# Patient Record
Sex: Male | Born: 1970 | Race: White | Hispanic: No | Marital: Single | Smoking: Never smoker
Health system: Southern US, Community
[De-identification: ages and names within clinical notes are randomized; demographics above are authoritative.]

## PROBLEM LIST (undated history)

## (undated) HISTORY — PX: KNEE SURGERY: SHX244

## (undated) HISTORY — PX: APPENDECTOMY: SHX54

---

## 2011-09-10 ENCOUNTER — Emergency Department (INDEPENDENT_AMBULATORY_CARE_PROVIDER_SITE_OTHER)
Admission: EM | Admit: 2011-09-10 | Discharge: 2011-09-10 | Disposition: A | Discharge status: Home / Self Care | Payer: Managed Care, Other (non HMO) | Source: Home / Self Care | Attending: Emergency Medicine | Admitting: Emergency Medicine

## 2011-09-10 DIAGNOSIS — J029 Acute pharyngitis, unspecified: Secondary | ICD-10-CM

## 2011-09-10 DIAGNOSIS — R05 Cough: Secondary | ICD-10-CM

## 2011-09-10 LAB — POCT RAPID STREP A (OFFICE): Rapid Strep A Screen: NEGATIVE

## 2011-09-10 MED ORDER — AZITHROMYCIN 250 MG PO TABS: ORAL_TABLET | ORAL | Status: AC

## 2011-09-10 MED ORDER — GUAIFENESIN-CODEINE 100-10 MG/5ML PO SYRP: 5.0000 mL | ORAL_SOLUTION | Freq: Four times a day (QID) | ORAL | Status: AC | PRN

## 2011-09-10 NOTE — ED Notes (Signed)
Symptoms started 3-4 days ago.

## 2011-09-10 NOTE — ED Provider Notes (Signed)
History     CSN: [id number] Arrival date & time: No admission date for patient encounter.   None     No chief complaint on file.   (Consider location/radiation/quality/duration/timing/severity/associated sxs/prior treatment) HPI Caidon is a 40 y.o. male who complains of onset of cold symptoms for 4 days.  + sore throat + cough No pleuritic pain No wheezing +nasal congestion + post-nasal drainage No sinus pain/pressure No chest congestion No itchy/red eyes No earache No hemoptysis No SOB No chills/sweats No fever No nausea No vomiting No abdominal pain No diarrhea No skin rashes + fatigue No myalgias No headache    No past medical history on file.  No past surgical history on file.  No family history on file.  History  Substance Use Topics  . Smoking status: Not on file  . Smokeless tobacco: Not on file  . Alcohol Use: Not on file      Review of Systems  Allergies  Review of patient's allergies indicates not on file.  Home Medications  No current outpatient prescriptions on file.  There were no vitals taken for this visit.  Physical Exam  Nursing note and vitals reviewed. Constitutional: He is oriented to person, place, and time. He appears well-developed and well-nourished.  HENT:  Head: Normocephalic and atraumatic.  Right Ear: Tympanic membrane, external ear and ear canal normal.  Left Ear: Tympanic membrane, external ear and ear canal normal.  Nose: Mucosal edema and rhinorrhea present.  Mouth/Throat: Posterior oropharyngeal erythema present. No oropharyngeal exudate or posterior oropharyngeal edema.  Eyes: No scleral icterus.  Neck: Neck supple.  Cardiovascular: Regular rhythm and normal heart sounds.   Pulmonary/Chest: Effort normal and breath sounds normal. No respiratory distress.  Neurological: He is alert and oriented to person, place, and time.  Skin: Skin is warm and dry.  Psychiatric: He has a normal mood and affect. His speech  is normal.    ED Course  Procedures (including critical care time)  Labs Reviewed - No data to display No results found.   No diagnosis found.    MDM  1)  Take the prescribed antibiotic as instructed.  The rapid strep is negative. 2)  Use nasal saline solution (over the counter) at least 3 times a day. 3)  Use over the counter decongestants like Zyrtec-D every 12 hours as needed to help with congestion.  If you have hypertension, do not take medicines with sudafed.  4)  Can take tylenol every 6 hours or motrin every 8 hours for pain or fever. 5)  Follow up with your primary doctor if no improvement in 5-7 days, sooner if increasing pain, fever, or new symptoms.       Lily Kocher, MD 09/10/11 [phone]

## 2018-01-03 ENCOUNTER — Other Ambulatory Visit: Payer: Self-pay

## 2018-01-03 ENCOUNTER — Encounter (HOSPITAL_BASED_OUTPATIENT_CLINIC_OR_DEPARTMENT_OTHER): Payer: Self-pay | Admitting: *Deleted

## 2018-01-03 ENCOUNTER — Emergency Department (HOSPITAL_BASED_OUTPATIENT_CLINIC_OR_DEPARTMENT_OTHER): Payer: No Typology Code available for payment source

## 2018-01-03 ENCOUNTER — Emergency Department (HOSPITAL_BASED_OUTPATIENT_CLINIC_OR_DEPARTMENT_OTHER)
Admission: EM | Admit: 2018-01-03 | Discharge: 2018-01-03 | Disposition: A | Discharge status: Home / Self Care | Payer: No Typology Code available for payment source | Attending: Emergency Medicine | Admitting: Emergency Medicine

## 2018-01-03 DIAGNOSIS — W228XXA Striking against or struck by other objects, initial encounter: Secondary | ICD-10-CM | POA: Diagnosis not present

## 2018-01-03 DIAGNOSIS — Y929 Unspecified place or not applicable: Secondary | ICD-10-CM | POA: Insufficient documentation

## 2018-01-03 DIAGNOSIS — Y998 Other external cause status: Secondary | ICD-10-CM | POA: Insufficient documentation

## 2018-01-03 DIAGNOSIS — S299XXA Unspecified injury of thorax, initial encounter: Secondary | ICD-10-CM | POA: Diagnosis present

## 2018-01-03 DIAGNOSIS — S20211A Contusion of right front wall of thorax, initial encounter: Secondary | ICD-10-CM

## 2018-01-03 DIAGNOSIS — Y9301 Activity, walking, marching and hiking: Secondary | ICD-10-CM | POA: Insufficient documentation

## 2018-01-03 MED ORDER — IBUPROFEN 800 MG PO TABS: 800.0000 mg | ORAL_TABLET | Freq: Four times a day (QID) | ORAL | 0 refills | Status: AC | PRN

## 2018-01-03 NOTE — Discharge Instructions (Signed)
Return here as needed.  Follow-up with your doctor for recheck.  Use ice and heat over the ribs that are sore.

## 2018-01-03 NOTE — ED Notes (Signed)
Pt verbalizes understanding of d/c instructions and denies any further needs at this time. 

## 2018-01-03 NOTE — ED Provider Notes (Signed)
[facility] EMERGENCY DEPARTMENT Provider Note   CSN: [id number] Arrival date & time: 01/03/18  1605     History   Chief Complaint Chief Complaint  Patient presents with  . Rib Injury    W/C injury     HPI Aaron Morrow is a 47 y.o. male.  HPI   Patient presents to the emergency department with right rib injury that occurred around 2-1/2 hours ago.  The patient states that he is walking up some steps when he slipped and landed against the railing with his right ribs.  The patient states that certain movements and palpation make the pain worse.  Patient states he did not take any medications prior to arrival.  Patient states that he has injured ribs in the past.  Patient states that nothing seems to make the condition better.  Patient denies any shortness of breath, nausea, vomiting, abdominal pain, weakness, difficulty breathing or syncope. History reviewed. No pertinent past medical history.Patient presents to the emergency department with  There are no active problems to display for this patient.   Past Surgical History:  Procedure Laterality Date  . APPENDECTOMY    . KNEE SURGERY          Home Medications    Prior to Admission medications   Not on File    Family History History reviewed. No pertinent family history.  Social History Social History   Tobacco Use  . Smoking status: Never Smoker  . Smokeless tobacco: Never Used  Substance Use Topics  . Alcohol use: Yes  . Drug use: No     Allergies   Patient has no known allergies.   Review of Systems Review of Systems All other systems negative except as documented in the HPI. All pertinent positives and negatives as reviewed in the HPI.  Physical Exam Updated Vital Signs BP (!) 170/96 (BP Location: Left Arm)   Pulse 74   Temp 98.8 F (37.1 C) (Oral)   Resp 18   Ht 6\' 4"  (1.93 m)   Wt 84.4 kg (186 lb)   SpO2 99%   BMI 22.64 kg/m   Physical Exam  Constitutional: He is oriented to  person, place, and time. He appears well-developed and well-nourished. No distress.  HENT:  Head: Normocephalic and atraumatic.  Mouth/Throat: Oropharynx is clear and moist.  Eyes: Pupils are equal, round, and reactive to light.  Neck: Normal range of motion. Neck supple.  Cardiovascular: Normal rate, regular rhythm and normal heart sounds. Exam reveals no gallop and no friction rub.  No murmur heard. Pulmonary/Chest: Effort normal and breath sounds normal. No respiratory distress. He has no wheezes. He exhibits tenderness.    Neurological: He is alert and oriented to person, place, and time. He exhibits normal muscle tone. Coordination normal.  Skin: Skin is warm and dry. Capillary refill takes less than 2 seconds. No rash noted. No erythema.  Psychiatric: He has a normal mood and affect. His behavior is normal.  Nursing note and vitals reviewed.    ED Treatments / Results  Labs (all labs ordered are listed, but only abnormal results are displayed) Labs Reviewed - No data to display  EKG None  Radiology Dg Ribs Unilateral W/chest Right  Result Date: 01/03/2018 CLINICAL DATA:  Fall with right sided rib pain EXAM: RIGHT RIBS AND CHEST - 3+ VIEW COMPARISON:  None. FINDINGS: Single-view chest demonstrates no acute consolidation or effusion. Normal heart size. No pneumothorax. Right rib series demonstrates no definite acute displaced right rib  fracture. IMPRESSION: Negative. Electronically Signed   By: Jasmine PangKim  Fujinaga M.D.   On: 01/03/2018 16:59    Procedures Procedures (including critical care time)  Medications Ordered in ED Medications - No data to display   Initial Impression / Assessment and Plan / ED Course  I have reviewed the triage vital signs and the nursing notes.  Pertinent labs & imaging results that were available during my care of the patient were reviewed by me and considered in my medical decision making (see chart for details).  Clinical Course as of Jan 03 1750  Wed Jan 03, 2018  1723 DG Ribs Unilateral W/Chest Right [LB]    Clinical Course User Index [LB] Luana ShuBritt, Lauren L, Student-PA    The patient has had a rib injury to the right and there is no fractures noted on x-ray.  Patient is advised of the plan and all questions were answered.  Told to use ice and heat on the area that is sore.  Patient has no respiratory issues at this time.  Final Clinical Impressions(s) / ED Diagnoses   Final diagnoses:  None    ED Discharge Orders    None       Charlestine NightLawyer, Vi Whitesel, PA-C 01/03/18 1753    Tegeler, Canary Brimhristopher J, MD 01/04/18 [phone]

## 2018-01-03 NOTE — ED Notes (Signed)
Per patients employer No drug screen needed

## 2018-01-03 NOTE — ED Triage Notes (Signed)
Pt c/o fall with right rib injury and pain x 2 hrs ago

## 2019-03-06 IMAGING — CR DG RIBS W/ CHEST 3+V*R*
3 series · 3 of 3 positions shown · non-contrast
Comparison: None.

CLINICAL DATA: Fall with right sided rib pain

EXAM:
RIGHT RIBS AND CHEST - 3+ VIEW

[w chest pa]
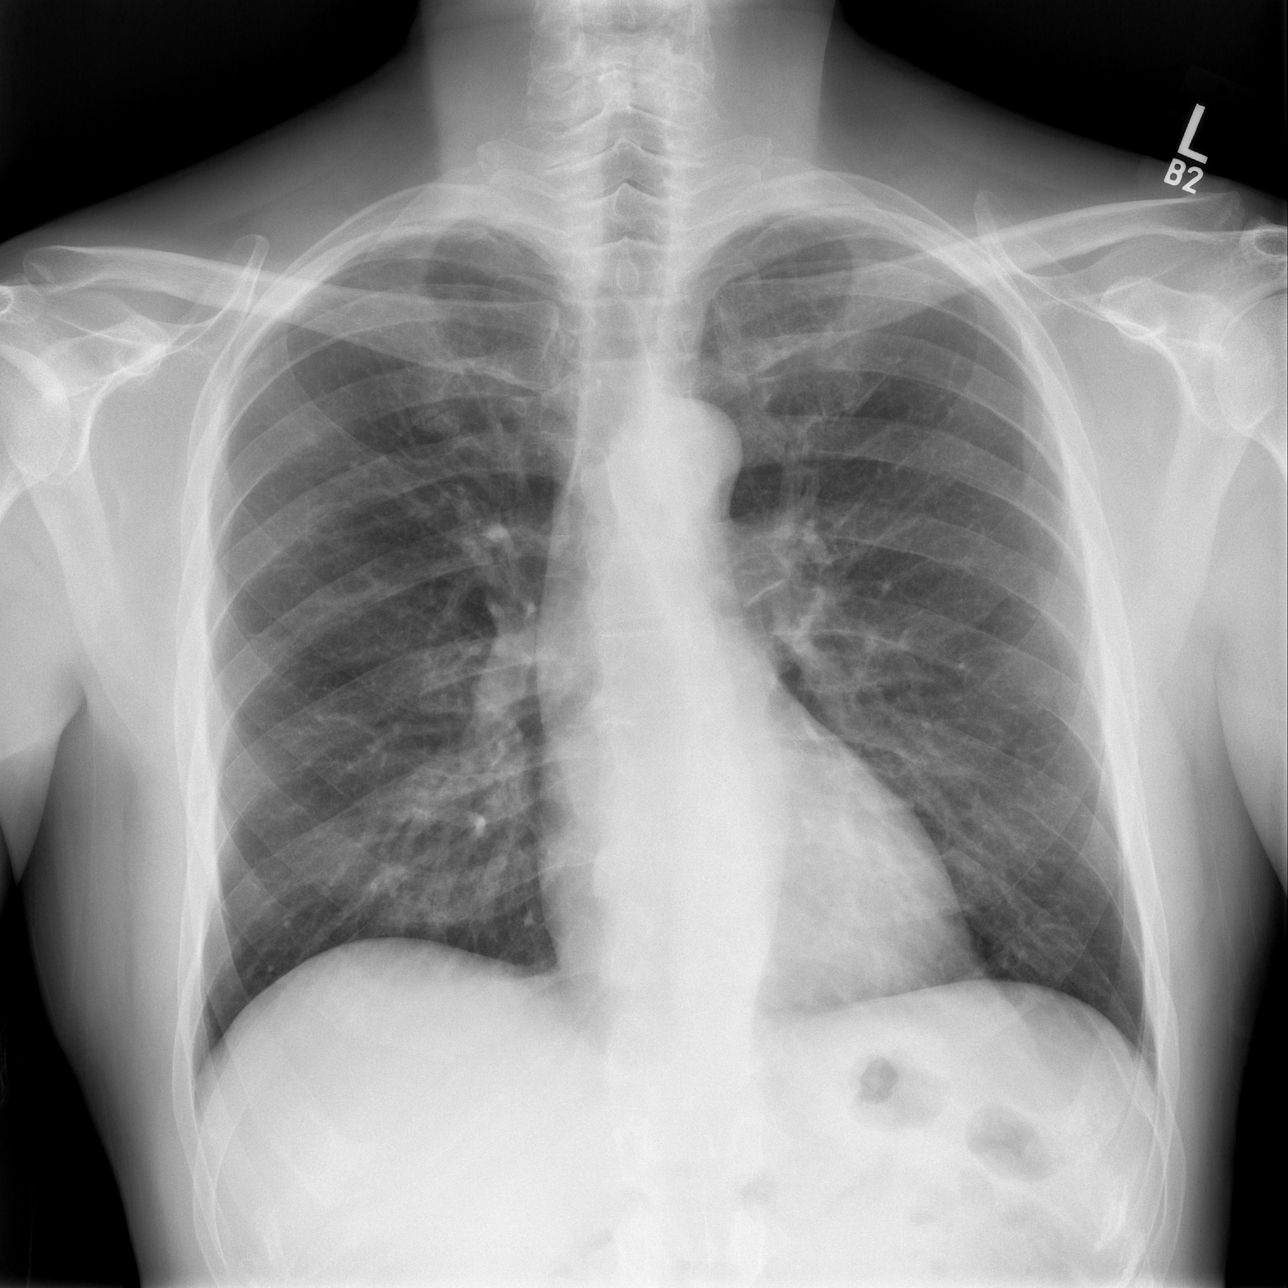

[w ribs ap/pa upper right]
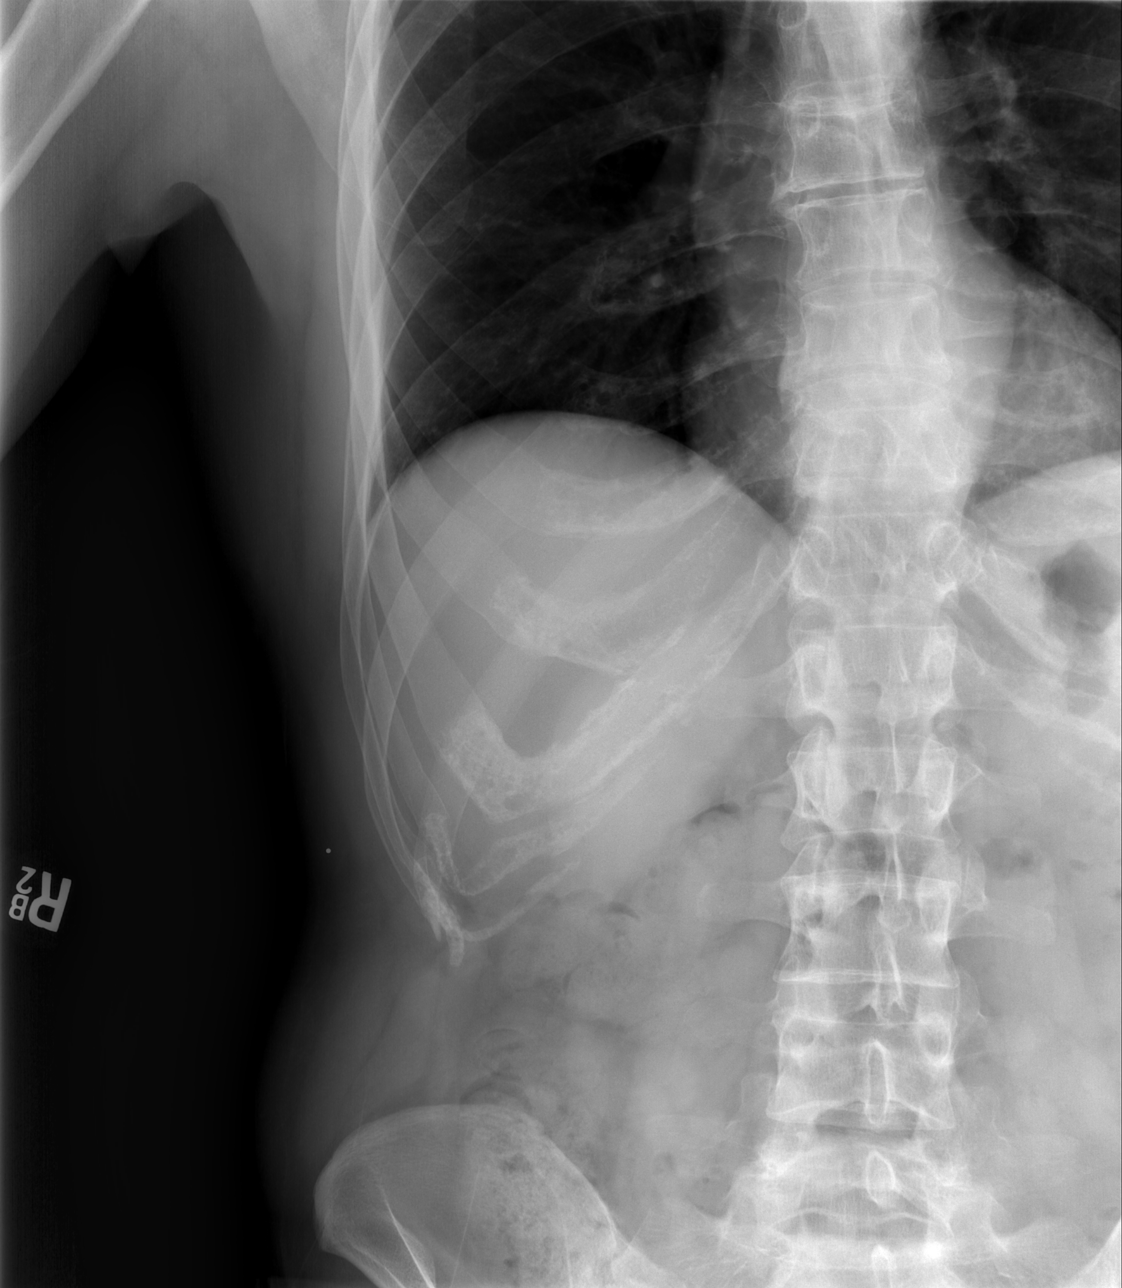

[w ribs oblique right]
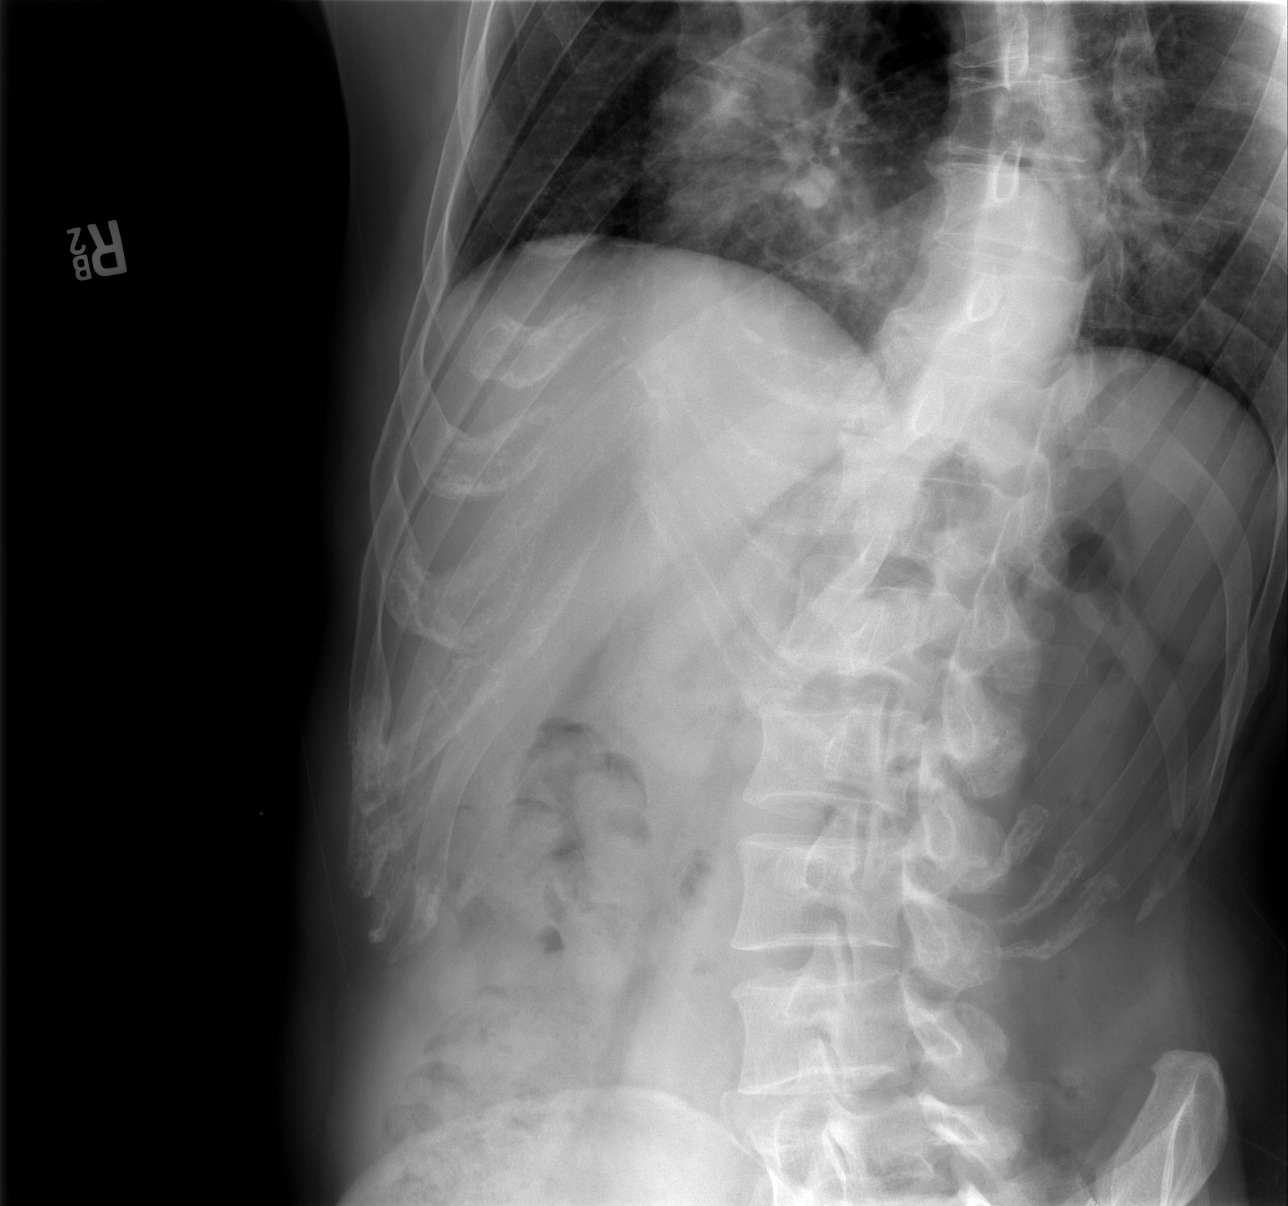

[3 of 3 positions shown; findings below may reference images not displayed]

FINDINGS: Single-view chest demonstrates no acute consolidation or effusion.
Normal heart size. No pneumothorax.

Right rib series demonstrates no definite acute displaced right rib
fracture.
IMPRESSION: Negative.
